# Patient Record
Sex: Male | Born: 1960 | Race: White | Hispanic: No | Marital: Married | Smoking: Never smoker
Health system: Southern US, Community
[De-identification: ages and names within clinical notes are randomized; demographics above are authoritative.]

## PROBLEM LIST (undated history)

## (undated) DIAGNOSIS — N200 Calculus of kidney: Secondary | ICD-10-CM

## (undated) HISTORY — PX: OTHER SURGICAL HISTORY: SHX169

## (undated) HISTORY — PX: APPENDECTOMY: SHX54

## (undated) HISTORY — DX: Calculus of kidney: N20.0

---

## 2004-05-14 ENCOUNTER — Ambulatory Visit: Payer: Self-pay | Admitting: Hematology & Oncology

## 2004-06-17 ENCOUNTER — Ambulatory Visit (HOSPITAL_COMMUNITY): Admission: RE | Admit: 2004-06-17 | Discharge: 2004-06-17 | Payer: Self-pay | Admitting: Hematology & Oncology

## 2004-07-02 ENCOUNTER — Ambulatory Visit (HOSPITAL_COMMUNITY): Admission: RE | Admit: 2004-07-02 | Discharge: 2004-07-02 | Payer: Self-pay | Admitting: Oncology

## 2005-08-12 ENCOUNTER — Emergency Department (HOSPITAL_COMMUNITY): Admission: EM | Admit: 2005-08-12 | Discharge: 2005-08-12 | Payer: Self-pay | Admitting: Emergency Medicine

## 2008-03-29 ENCOUNTER — Emergency Department (HOSPITAL_COMMUNITY): Admission: EM | Admit: 2008-03-29 | Discharge: 2008-03-29 | Payer: Self-pay | Admitting: Emergency Medicine

## 2008-04-09 ENCOUNTER — Ambulatory Visit (HOSPITAL_BASED_OUTPATIENT_CLINIC_OR_DEPARTMENT_OTHER): Admission: RE | Admit: 2008-04-09 | Discharge: 2008-04-09 | Payer: Self-pay | Admitting: Urology

## 2010-02-09 ENCOUNTER — Encounter: Payer: Self-pay | Admitting: Hematology & Oncology

## 2010-05-01 LAB — POCT I-STAT, CHEM 8
BUN: 34 mg/dL — ABNORMAL HIGH (ref 6–23)
Creatinine, Ser: 1.3 mg/dL (ref 0.4–1.5)
Glucose, Bld: 125 mg/dL — ABNORMAL HIGH (ref 70–99)
Glucose, Bld: 132 mg/dL — ABNORMAL HIGH (ref 70–99)
HCT: 46 % (ref 39.0–52.0)
HCT: 47 % (ref 39.0–52.0)
Hemoglobin: 16 g/dL (ref 13.0–17.0)
TCO2: 21 mmol/L (ref 0–100)

## 2010-05-01 LAB — CBC
HCT: 44.2 % (ref 39.0–52.0)
MCHC: 33.8 g/dL (ref 30.0–36.0)
MCV: 90.3 fL (ref 78.0–100.0)
RBC: 4.89 MIL/uL (ref 4.22–5.81)

## 2010-05-01 LAB — URINALYSIS, ROUTINE W REFLEX MICROSCOPIC
Bilirubin Urine: NEGATIVE
Specific Gravity, Urine: 1.024 (ref 1.005–1.030)
pH: 7 (ref 5.0–8.0)

## 2010-05-01 LAB — DIFFERENTIAL
Basophils Relative: 1 % (ref 0–1)
Eosinophils Relative: 3 % (ref 0–5)
Lymphocytes Relative: 21 % (ref 12–46)
Monocytes Absolute: 0.3 10*3/uL (ref 0.1–1.0)

## 2010-05-01 LAB — URINE MICROSCOPIC-ADD ON

## 2010-06-03 NOTE — Op Note (Signed)
NAME:  Duane Johnson, Duane Johnson                 ACCOUNT NO.:  [id number]   MEDICAL RECORD NO.:  [id number]          PATIENT TYPE:  AMB   LOCATION:  [facility]                         FACILITY:  [facility]   PHYSICIAN:  Sigmund I. Patsi Sears, M.D.DATE OF BIRTH:  1960/08/12   DATE OF PROCEDURE:  DATE OF DISCHARGE:                               OPERATIVE REPORT   PREOPERATIVE DIAGNOSIS:  History of 4.5 mm distal right ureteral  calculus.   POSTOPERATIVE DIAGNOSIS:  Passed right ureteral calculus.   OPERATIONS:  1. Cystourethroscopy.  2. Right retrograde pyelogram with interpretation.  3. Right ureteroscopy.   SURGEON:  S. Patsi Sears, M.D.   ANESTHESIA:  General LMA.   PREPARATION:  After appropriate preanesthesia, the patient was brought  to the operating placed on placed on the operating room table in the  dorsal supine position where general LMA anesthesia was induced.  He was  then replaced in the dorsal lithotomy position where the pubis was  prepped with Betadine solution and draped in the usual fashion.   REVIEW OF HISTORY:  This 50 year old male has history of a 4.5 mm right  ureteral stone, seen at [facility] on March 29, 2008,  with nausea and vomiting.  He has continued to have gross hematuria with  clot formation; strained his urine, but has not passed a stone.  On  Saturday evening, the patient passed more blood and clot, and looked  through the blood and clot, but did not see a stone.  He is now for  cystoscopy with retrograde pyelogram and ureteroscopy.   PROCEDURE:  Cystourethroscopy was accomplished, it shows a normal-  appearing urethra and normal bladder neck.  The bladder base was normal,  and there was no evidence of bladder, stone, tumor or diverticular  formation.  The right ureteral orifice was noted to be edematous, and  there was some blood at the right ureteral orifice.  A  KUB was  accomplished, but no stone was identified.  A right retrograde pyelogram  was performed and no stone was identified, although there was some  dilation of the lower ureter.  Ureteroscopy was therefore accomplished,  and I was unable to demonstrate the ureteral stone by ureteroscopy  either.  The entire urinary tract was evaluated up to the level of the  renal pelvis, and, again, I did not see a stone.  I could see evidence  in the distal ureter where the stone  may have been impacted.  The patient was given IV Toradol, awakened, and  taken to the recovery room in good condition.  The ureteroscope was  removed.  signed 04/12/08 @ 4pm      Sigmund I. Patsi Sears, M.D.  Electronically Signed     SIT/MEDQ  D:  04/09/2008  T:  04/09/2008  Job:  [id number]   cc:   [employer]

## 2011-12-11 ENCOUNTER — Encounter (HOSPITAL_COMMUNITY): Payer: Self-pay | Admitting: Emergency Medicine

## 2011-12-11 ENCOUNTER — Emergency Department (HOSPITAL_COMMUNITY): Payer: BC Managed Care – PPO

## 2011-12-11 ENCOUNTER — Emergency Department (HOSPITAL_COMMUNITY)
Admission: EM | Admit: 2011-12-11 | Discharge: 2011-12-11 | Disposition: A | Discharge status: Home / Self Care | Payer: BC Managed Care – PPO | Attending: Emergency Medicine | Admitting: Emergency Medicine

## 2011-12-11 DIAGNOSIS — Y9289 Other specified places as the place of occurrence of the external cause: Secondary | ICD-10-CM | POA: Insufficient documentation

## 2011-12-11 DIAGNOSIS — R040 Epistaxis: Secondary | ICD-10-CM | POA: Insufficient documentation

## 2011-12-11 DIAGNOSIS — R296 Repeated falls: Secondary | ICD-10-CM | POA: Insufficient documentation

## 2011-12-11 DIAGNOSIS — R220 Localized swelling, mass and lump, head: Secondary | ICD-10-CM | POA: Insufficient documentation

## 2011-12-11 DIAGNOSIS — Y9389 Activity, other specified: Secondary | ICD-10-CM | POA: Insufficient documentation

## 2011-12-11 DIAGNOSIS — S0120XA Unspecified open wound of nose, initial encounter: Secondary | ICD-10-CM | POA: Insufficient documentation

## 2011-12-11 DIAGNOSIS — R221 Localized swelling, mass and lump, neck: Secondary | ICD-10-CM | POA: Insufficient documentation

## 2011-12-11 DIAGNOSIS — S0121XA Laceration without foreign body of nose, initial encounter: Secondary | ICD-10-CM

## 2011-12-11 DIAGNOSIS — R51 Headache: Secondary | ICD-10-CM | POA: Insufficient documentation

## 2011-12-11 DIAGNOSIS — S0992XA Unspecified injury of nose, initial encounter: Secondary | ICD-10-CM

## 2011-12-11 DIAGNOSIS — S0993XA Unspecified injury of face, initial encounter: Secondary | ICD-10-CM | POA: Insufficient documentation

## 2011-12-11 NOTE — ED Provider Notes (Signed)
History     CSN: [id number]  Arrival date & time 12/11/11  1625   First MD Initiated Contact with Patient 12/11/11 1643      Chief Complaint  Patient presents with  . Facial Laceration    (Consider location/radiation/quality/duration/timing/severity/associated sxs/prior treatment) HPI Duane Johnson is a 51 y.o. male who presents with complaint of a nose injury. Pt states he was loading a truck and a ramp fell down onto his nose. States laceration and pain to the nose. No deformity. No LOC. States is having a headache. No confusion, memory problems, no dizziness, nausea, vomiting. Not on blood thinners. Tetanus up to date.   History reviewed. No pertinent past medical history.  History reviewed. No pertinent past surgical history.  History reviewed. No pertinent family history.  History  Substance Use Topics  . Smoking status: Never Smoker   . Smokeless tobacco: Not on file  . Alcohol Use: Yes      Review of Systems  Constitutional: Negative for fever and chills.  HENT: Positive for nosebleeds and facial swelling. Negative for neck pain.   Skin: Positive for wound.  Neurological: Positive for headaches. Negative for dizziness, weakness and numbness.    Allergies  Penicillins  Home Medications  No current outpatient prescriptions on file.  BP 115/76  Pulse 70  Temp 98.7 F (37.1 C) (Oral)  Resp 16  SpO2 99%  Physical Exam  Nursing note and vitals reviewed. Constitutional: He is oriented to person, place, and time. He appears well-developed and well-nourished. No distress.  HENT:  Head: Normocephalic.  Right Ear: External ear normal.  Left Ear: External ear normal.  Nose: Nose normal.  Mouth/Throat: Oropharynx is clear and moist.       TMs normal bilaterally, no hemotympanum. There is a V shaped, about 4cm laceration to the bridge of the nose, gaping, hemostatic. No deformity of the nose noted. No tenderness with palpation over the frontal bone, orbital  bones, or maxilla. No blood or septal hematoma intranasally.   Neck: Normal range of motion. Neck supple.  Cardiovascular: Normal rate, regular rhythm and normal heart sounds.   Pulmonary/Chest: Effort normal and breath sounds normal. No respiratory distress. He has no wheezes. He has no rales.  Neurological: He is alert and oriented to person, place, and time. No cranial nerve deficit. Coordination normal.  Skin: Skin is warm and dry.  Psychiatric: He has a normal mood and affect.    ED Course  Procedures (including critical care time)  Pt with laceration to the bridge of the nose. Will get X-rays.   Dg Nasal Bones  12/11/2011  *RADIOLOGY REPORT*  Clinical Data: Nasal laceration.  NASAL BONES - 3+ VIEW  Comparison: None.  Findings: No evidence of acute nasal bone fracture.  The nasal septum appears midline.  Other visualized facial bones and paranasal sinuses are unremarkable.  IMPRESSION: No evidence of nasal bone fracture.   Original Report Authenticated By: Irish Lack, M.D.    LACERATION REPAIR Performed by: Lottie Mussel Authorized by: Jaynie Crumble A Consent: Verbal consent obtained. Risks and benefits: risks, benefits and alternatives were discussed Consent given by: patient Patient identity confirmed: provided demographic data Prepped and Draped in normal sterile fashion Wound explored  Laceration Location: bridge of the nose  Laceration Length: 4cm  No Foreign Bodies seen or palpated  Anesthesia: local infiltration  Local anesthetic: lidocaine 1% wo epinephrine  Anesthetic total: 3 ml  Irrigation method: syringe Amount of cleaning: standard  Skin closure: prolene  6.0  Number of sutures: 9  Technique: simple interrupted  Patient tolerance: Patient tolerated the procedure well with no immediate complications.   1. Nasal injury   2. Laceration of nose       MDM  Pt with acute nasal laceration/contusion. Laceration repaired with  stitches. X-ray negative for fracture. No signs of major head trauma. Pt non toxic. D/c home with topical antibiotics, ice, NSAIDs.         Lottie Mussel, PA 12/11/11 1803

## 2011-12-11 NOTE — ED Notes (Signed)
Pt states he was loading a truck when the gate fell down and grazed his nose causing a 1" V shaped laceration.  Pt states he is still able to breathe through nose.  Pt states last tetanus shot 2-3 years ago.  No deformities on nose besides laceration.  Pt denies hitting head.

## 2011-12-12 NOTE — ED Provider Notes (Signed)
Medical screening examination/treatment/procedure(s) were performed by non-physician practitioner and as supervising physician I was immediately available for consultation/collaboration. Devoria Albe, MD, Armando Gang   Ward Givens, MD 12/12/11 [phone]

## 2013-03-29 IMAGING — CR DG NASAL BONES 3+V
3 series · 3 of 3 positions shown · non-contrast
Comparison: None.

CLINICAL DATA: Nasal laceration.

NASAL BONES - 3+ VIEW

[w nasal bone lat (1 of 2)]
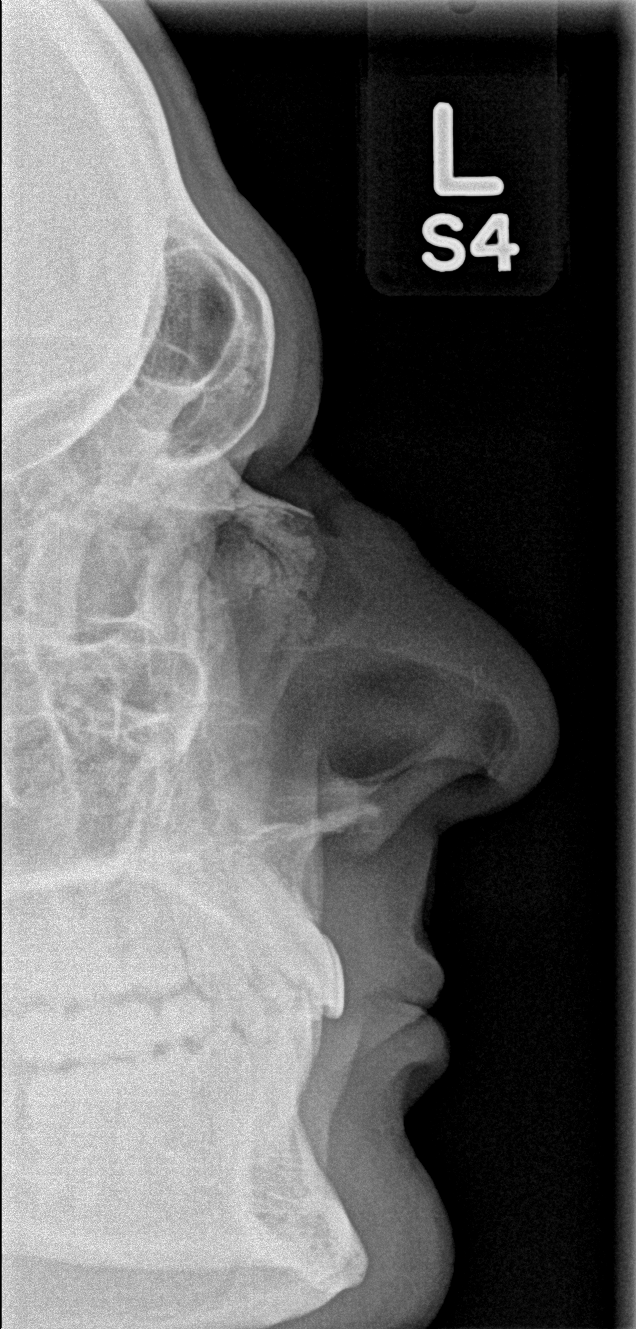

[w nasal bone lat (2 of 2)]
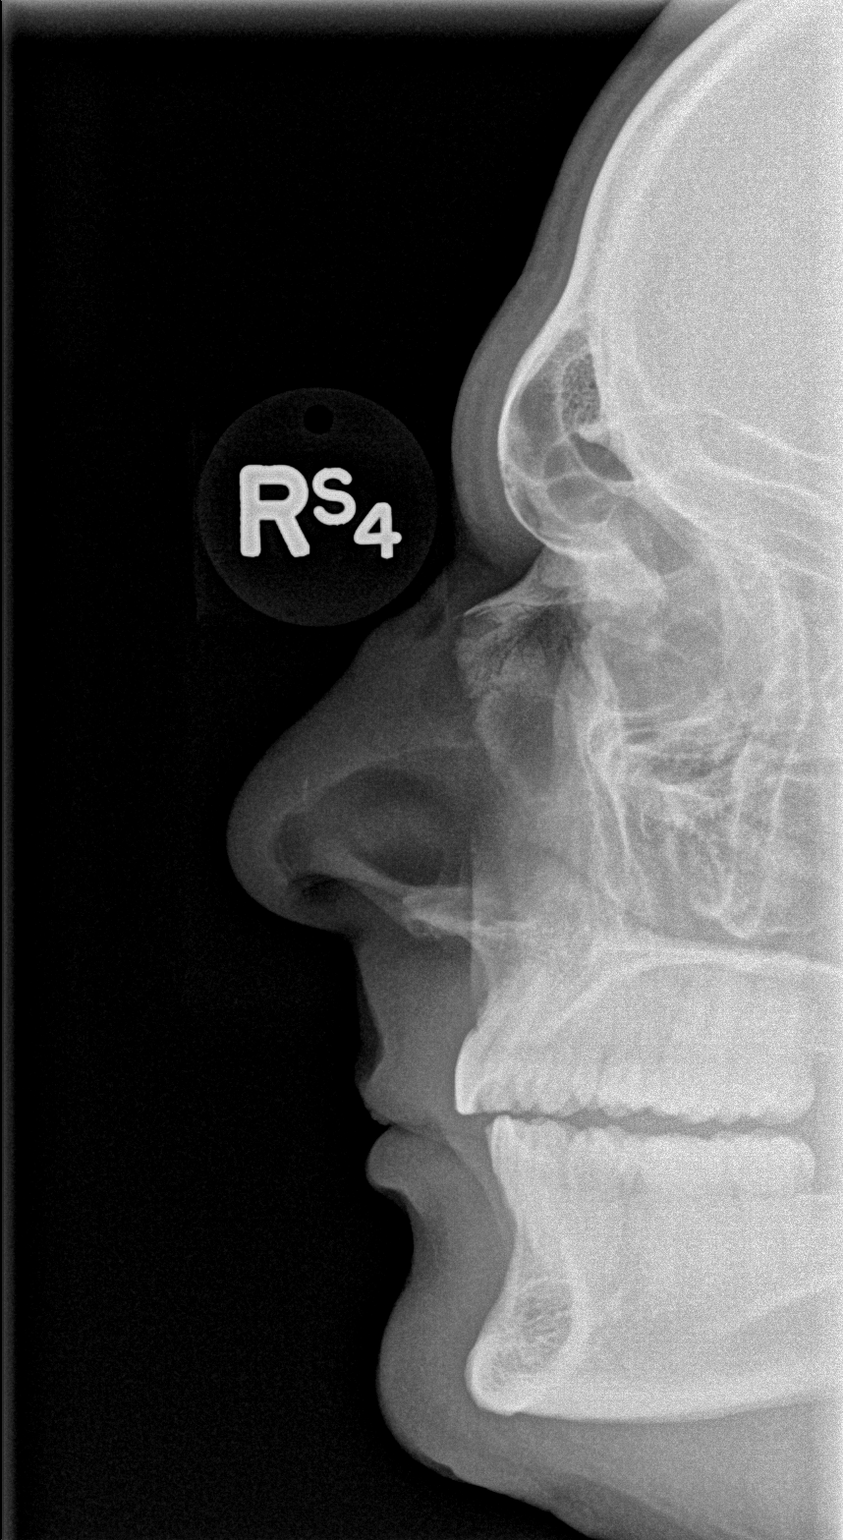

[w waters pa]
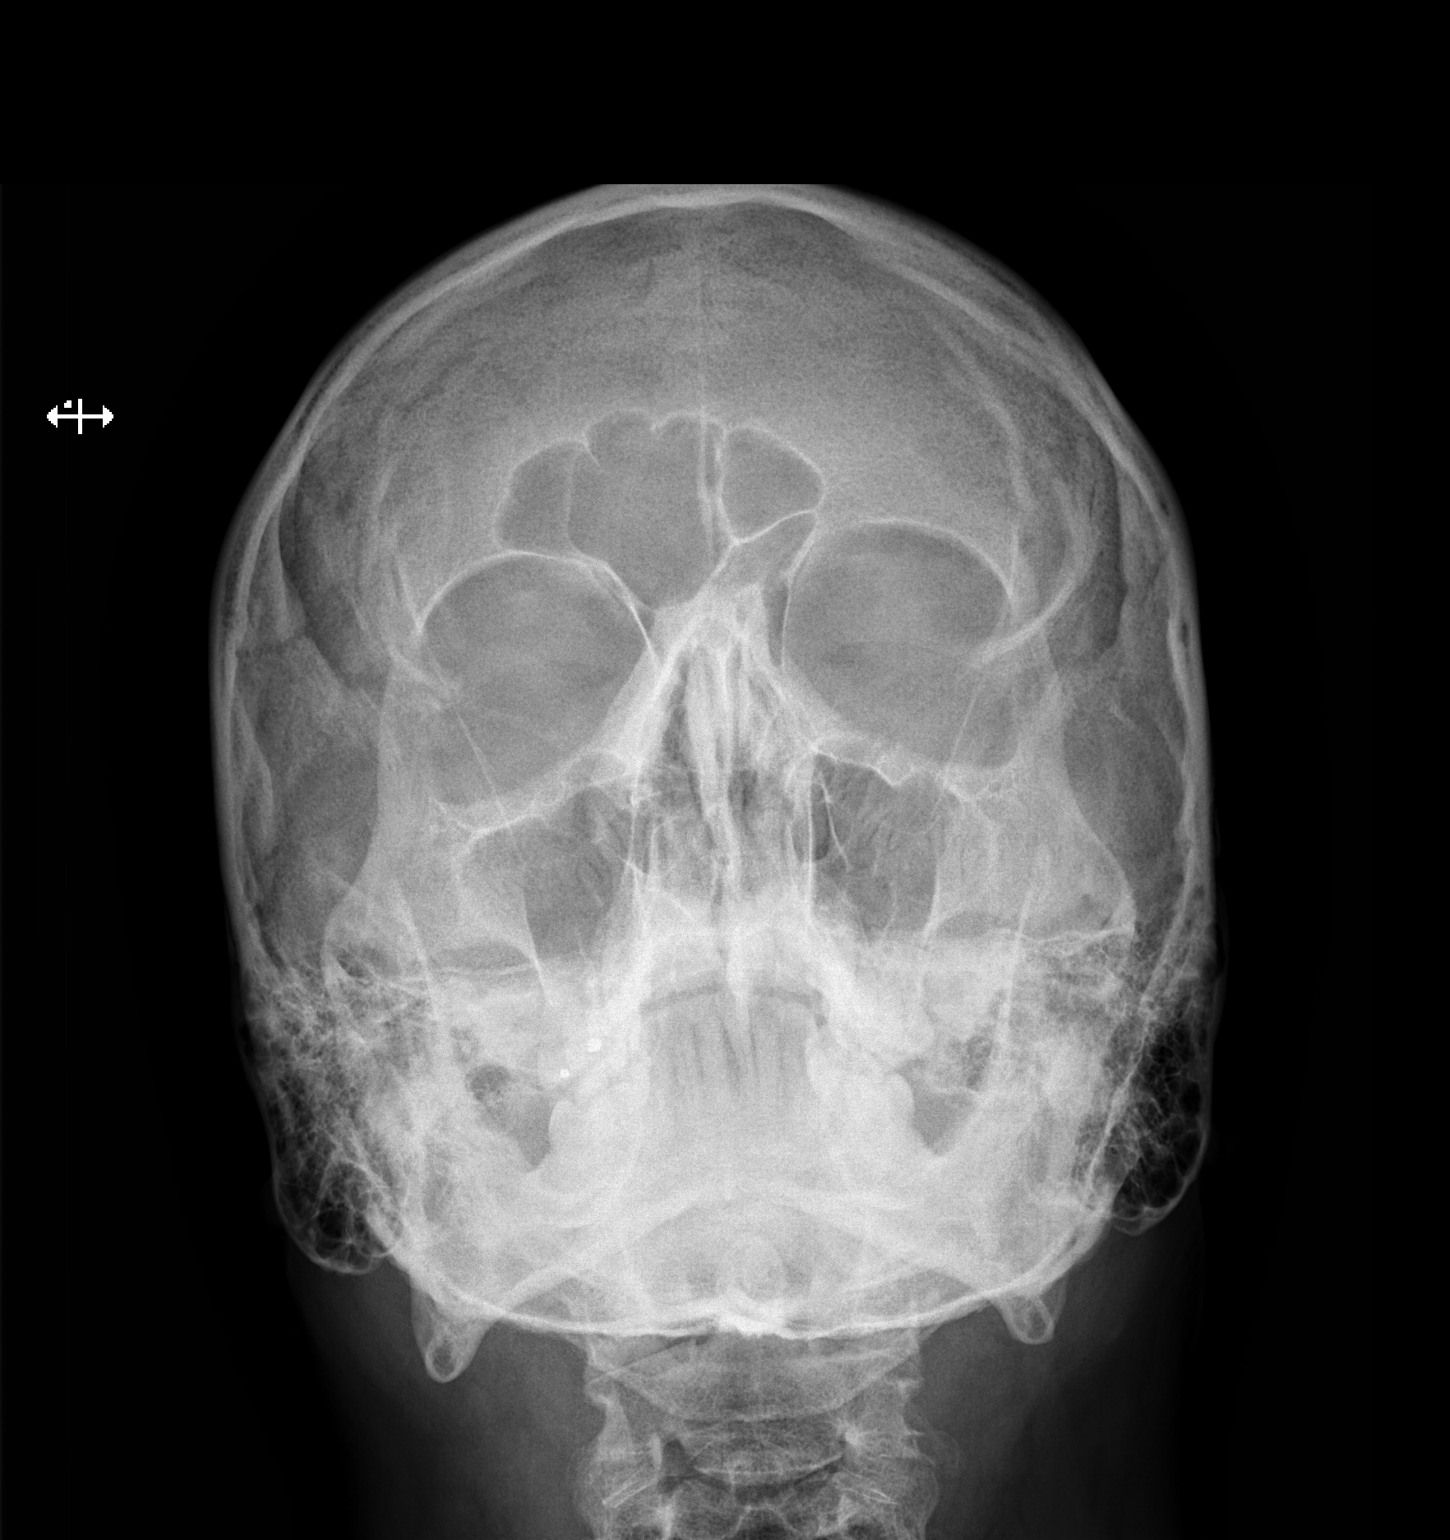

[3 of 3 positions shown; findings below may reference images not displayed]

FINDINGS: No evidence of acute nasal bone fracture.  The nasal
septum appears midline.  Other visualized facial bones and
paranasal sinuses are unremarkable.
IMPRESSION: No evidence of nasal bone fracture.

## 2014-07-29 ENCOUNTER — Emergency Department (HOSPITAL_COMMUNITY)
Admission: EM | Admit: 2014-07-29 | Discharge: 2014-07-29 | Disposition: A | Discharge status: Home / Self Care | Payer: Self-pay | Attending: Emergency Medicine | Admitting: Emergency Medicine

## 2014-07-29 ENCOUNTER — Encounter (HOSPITAL_COMMUNITY): Payer: Self-pay

## 2014-07-29 DIAGNOSIS — Z9089 Acquired absence of other organs: Secondary | ICD-10-CM | POA: Insufficient documentation

## 2014-07-29 DIAGNOSIS — R61 Generalized hyperhidrosis: Secondary | ICD-10-CM | POA: Insufficient documentation

## 2014-07-29 DIAGNOSIS — Z87442 Personal history of urinary calculi: Secondary | ICD-10-CM | POA: Insufficient documentation

## 2014-07-29 DIAGNOSIS — Z880 Allergy status to penicillin: Secondary | ICD-10-CM | POA: Insufficient documentation

## 2014-07-29 DIAGNOSIS — R319 Hematuria, unspecified: Secondary | ICD-10-CM | POA: Insufficient documentation

## 2014-07-29 DIAGNOSIS — R1030 Lower abdominal pain, unspecified: Secondary | ICD-10-CM | POA: Insufficient documentation

## 2014-07-29 DIAGNOSIS — F1099 Alcohol use, unspecified with unspecified alcohol-induced disorder: Secondary | ICD-10-CM | POA: Insufficient documentation

## 2014-07-29 DIAGNOSIS — R112 Nausea with vomiting, unspecified: Secondary | ICD-10-CM | POA: Insufficient documentation

## 2014-07-29 LAB — URINALYSIS, ROUTINE W REFLEX MICROSCOPIC
BILIRUBIN URINE: NEGATIVE
Glucose, UA: NEGATIVE mg/dL
Ketones, ur: NEGATIVE mg/dL
Leukocytes, UA: NEGATIVE
NITRITE: NEGATIVE
PH: 5.5 (ref 5.0–8.0)
Protein, ur: NEGATIVE mg/dL
SPECIFIC GRAVITY, URINE: 1.018 (ref 1.005–1.030)
Urobilinogen, UA: 0.2 mg/dL (ref 0.0–1.0)

## 2014-07-29 LAB — URINE MICROSCOPIC-ADD ON

## 2014-07-29 MED ORDER — OXYCODONE-ACETAMINOPHEN 5-325 MG PO TABS: 1.0000 | ORAL_TABLET | Freq: Four times a day (QID) | ORAL | Status: AC | PRN

## 2014-07-29 MED ORDER — ONDANSETRON HCL 4 MG PO TABS: 4.0000 mg | ORAL_TABLET | Freq: Four times a day (QID) | ORAL | Status: DC

## 2014-07-29 MED ORDER — TAMSULOSIN HCL 0.4 MG PO CAPS: 0.4000 mg | ORAL_CAPSULE | Freq: Every day | ORAL | Status: DC

## 2014-07-29 NOTE — ED Notes (Signed)
Pt was also given 30 Toradol by EMS

## 2014-07-29 NOTE — ED Notes (Signed)
Per EMS - pt hx of kidney stones. C/o suprapubic pain - 10/10. Pt was nauseated, vomiting, and diaphoretic upon EMS arrival. Given 250 fentanyl and 4 zofran with relief. BP 115/68, hr 50bpm.

## 2014-07-29 NOTE — ED Provider Notes (Signed)
CSN: [id number]     Arrival date & time 07/29/14  0753 History   First MD Initiated Contact with Patient 07/29/14 0754     Chief Complaint  Patient presents with  . Abdominal Pain     (Consider location/radiation/quality/duration/timing/severity/associated sxs/prior Treatment) HPI Comments: 54 year old male presenting with sudden onset, 10/10 epigastric abdominal pain that migrated down to his suprapubic area beginning at 4 AM today when he woke up to urinate.  Pain was constant until EMS arrival, when he was given 250 mcg fentanyl, 30 mg Toradol and 4 mg zofran with complete relief of his pain.  Admits to associated nausea, diaphoresis and an episode of watery emesis.  History of a kidney stone in the past, states this pain is more intense in the pain with his kidney stone again in his back.  Denies back pain, CP, SOB.  Denies increased urinary frequency, urgency, dysuria or hematuria.  Denies fevers or diarrhea.  Social alcohol use only, has not had any alcohol in about a week.  No dietary changes.  Patient is a 54 y.o. male presenting with abdominal pain. The history is provided by the patient and the EMS personnel.  Abdominal Pain Associated symptoms: nausea and vomiting     History reviewed. No pertinent past medical history. Past Surgical History  Procedure Laterality Date  . Appendectomy     History reviewed. No pertinent family history. History  Substance Use Topics  . Smoking status: Never Smoker   . Smokeless tobacco: Not on file  . Alcohol Use: Yes     Comment: occasional    Review of Systems  Constitutional: Positive for diaphoresis.  Gastrointestinal: Positive for nausea, vomiting and abdominal pain.  All other systems reviewed and are negative.     Allergies  Penicillins  Home Medications   Prior to Admission medications   Medication Sig Start Date End Date Taking? Authorizing Provider  ondansetron (ZOFRAN) 4 MG tablet Take 1 tablet (4 mg total) by mouth  every 6 (six) hours. 07/29/14   Kathrynn Speed, PA-C  oxyCODONE-acetaminophen (PERCOCET) 5-325 MG per tablet Take 1-2 tablets by mouth every 6 (six) hours as needed for severe pain. 07/29/14   Kathrynn Speed, PA-C  tamsulosin (FLOMAX) 0.4 MG CAPS capsule Take 1 capsule (0.4 mg total) by mouth daily. 07/29/14   Noriko Macari M Patriciaann Rabanal, PA-C   BP 115/72 mmHg  Pulse 66  Temp(Src) 98.4 F (36.9 C) (Oral)  Resp 16  Ht  (1.727 m)  Wt 180 lb (81.647 kg)  BMI 27.38 kg/m2  SpO2 95% Physical Exam  Constitutional: He is oriented to person, place, and time. He appears well-developed and well-nourished. No distress.  HENT:  Head: Normocephalic and atraumatic.  Eyes: Conjunctivae and EOM are normal.  Neck: Normal range of motion. Neck supple.  Cardiovascular: Normal rate, regular rhythm and normal heart sounds.   Pulmonary/Chest: Effort normal and breath sounds normal.  Abdominal: Soft. Normal appearance and bowel sounds are normal. He exhibits no distension. There is no tenderness. There is no rigidity, no rebound, no guarding and no CVA tenderness.  Musculoskeletal: Normal range of motion. He exhibits no edema.  Neurological: He is alert and oriented to person, place, and time.  Skin: Skin is warm and dry.  Psychiatric: He has a normal mood and affect. His behavior is normal.  Nursing note and vitals reviewed.   ED Course  Procedures (including critical care time) Labs Review Labs Reviewed  URINALYSIS, ROUTINE W REFLEX MICROSCOPIC (NOT AT [facility]) -  Abnormal; Notable for the following:    Hgb urine dipstick LARGE (*)    All other components within normal limits  URINE MICROSCOPIC-ADD ON    Imaging Review No results found.   EKG Interpretation None      MDM   Final diagnoses:  Suprapubic abdominal pain, unspecified laterality  Hematuria   Nontoxic appearing, NAD.  AF VSS.  Asymptomatic in the ED.  Abdomen is soft, nontender.  No CVA tenderness.  UA significant for hematuria.  Culture pending.   I feel the patient's pain most likely came from a kidney or ureteral stone.  I do not feel imaging is necessary at this time as patient is having no difficulty urinating and will not change course of care.  Hematuria, most likely from the kidney stone being present or past.  Will discharge patient home with Flomax, urine strainer, Zofran, and Percocet.  Follow-up with urology.  Stable for discharge. Return precautions given. Patient states understanding of treatment care plan and is agreeable.  Kathrynn SpeedRobyn M Torre Pikus, PA-C 07/29/14 [id number]  Mirian MoMatthew Gentry, MD 07/30/14 [phone]

## 2014-07-29 NOTE — Discharge Instructions (Signed)
Take Flomax as prescribed. Take Zofran as directed as needed for nausea. Take percocet for severe pain only. No driving or operating heavy machinery while taking percocet. This medication may cause drowsiness. Kidney Stones Kidney stones (urolithiasis) are deposits that form inside your kidneys. The intense pain is caused by the stone moving through the urinary tract. When the stone moves, the ureter goes into spasm around the stone. The stone is usually passed in the urine.  CAUSES   A disorder that makes certain neck glands produce too much parathyroid hormone (primary hyperparathyroidism).  A buildup of uric acid crystals, similar to gout in your joints.  Narrowing (stricture) of the ureter.  A kidney obstruction present at birth (congenital obstruction).  Previous surgery on the kidney or ureters.  Numerous kidney infections. SYMPTOMS   Feeling sick to your stomach (nauseous).  Throwing up (vomiting).  Blood in the urine (hematuria).  Pain that usually spreads (radiates) to the groin.  Frequency or urgency of urination. DIAGNOSIS   Taking a history and physical exam.  Blood or urine tests.  CT scan.  Occasionally, an examination of the inside of the urinary bladder (cystoscopy) is performed. TREATMENT   Observation.  Increasing your fluid intake.  Extracorporeal shock wave lithotripsy--This is a noninvasive procedure that uses shock waves to break up kidney stones.  Surgery may be needed if you have severe pain or persistent obstruction. There are various surgical procedures. Most of the procedures are performed with the use of small instruments. Only small incisions are needed to accommodate these instruments, so recovery time is minimized. The size, location, and chemical composition are all important variables that will determine the proper choice of action for you. Talk to your health care provider to better understand your situation so that you will minimize the  risk of injury to yourself and your kidney.  HOME CARE INSTRUCTIONS   Drink enough water and fluids to keep your urine clear or pale yellow. This will help you to pass the stone or stone fragments.  Strain all urine through the provided strainer. Keep all particulate matter and stones for your health care provider to see. The stone causing the pain may be as small as a grain of salt. It is very important to use the strainer each and every time you pass your urine. The collection of your stone will allow your health care provider to analyze it and verify that a stone has actually passed. The stone analysis will often identify what you can do to reduce the incidence of recurrences.  Only take over-the-counter or prescription medicines for pain, discomfort, or fever as directed by your health care provider.  Make a follow-up appointment with your health care provider as directed.  Get follow-up X-rays if required. The absence of pain does not always mean that the stone has passed. It may have only stopped moving. If the urine remains completely obstructed, it can cause loss of kidney function or even complete destruction of the kidney. It is your responsibility to make sure X-rays and follow-ups are completed. Ultrasounds of the kidney can show blockages and the status of the kidney. Ultrasounds are not associated with any radiation and can be performed easily in a matter of minutes. SEEK MEDICAL CARE IF:  You experience pain that is progressive and unresponsive to any pain medicine you have been prescribed. SEEK IMMEDIATE MEDICAL CARE IF:   Pain cannot be controlled with the prescribed medicine.  You have a fever or shaking chills.  The  severity or intensity of pain increases over 18 hours and is not relieved by pain medicine.  You develop a new onset of abdominal pain.  You feel faint or pass out.  You are unable to urinate. MAKE SURE YOU:   Understand these instructions.  Will watch  your condition.  Will get help right away if you are not doing well or get worse. Document Released: 01/05/2005 Document Revised: 09/07/2012 Document Reviewed: 06/08/2012 [facility] Patient Information 2015 [place], [place]. This information is not intended to replace advice given to you by your health care provider. Make sure you discuss any questions you have with your health care provider.  Hematuria Hematuria is blood in your urine. It can be caused by a bladder infection, kidney infection, prostate infection, kidney stone, or cancer of your urinary tract. Infections can usually be treated with medicine, and a kidney stone usually will pass through your urine. If neither of these is the cause of your hematuria, further workup to find out the reason may be needed. It is very important that you tell your health care provider about any blood you see in your urine, even if the blood stops without treatment or happens without causing pain. Blood in your urine that happens and then stops and then happens again can be a symptom of a very serious condition. Also, pain is not a symptom in the initial stages of many urinary cancers. HOME CARE INSTRUCTIONS   Drink lots of fluid, 3-4 quarts a day. If you have been diagnosed with an infection, cranberry juice is especially recommended, in addition to large amounts of water.  Avoid caffeine, tea, and carbonated beverages because they tend to irritate the bladder.  Avoid alcohol because it may irritate the prostate.  Take all medicines as directed by your health care provider.  If you were prescribed an antibiotic medicine, finish it all even if you start to feel better.  If you have been diagnosed with a kidney stone, follow your health care provider's instructions regarding straining your urine to catch the stone.  Empty your bladder often. Avoid holding urine for long periods of time.  After a bowel movement, women should cleanse front to back. Use  each tissue only once.  Empty your bladder before and after sexual intercourse if you are a male. SEEK MEDICAL CARE IF:  You develop back pain.  You have a fever.  You have a feeling of sickness in your stomach (nausea) or vomiting.  Your symptoms are not better in 3 days. Return sooner if you are getting worse. SEEK IMMEDIATE MEDICAL CARE IF:   You develop severe vomiting and are unable to keep the medicine down.  You develop severe back or abdominal pain despite taking your medicines.  You begin passing a large amount of blood or clots in your urine.  You feel extremely weak or faint, or you pass out. MAKE SURE YOU:   Understand these instructions.  Will watch your condition.  Will get help right away if you are not doing well or get worse. Document Released: 01/05/2005 Document Revised: 05/22/2013 Document Reviewed: 09/05/2012 [facility] Patient Information 2015 [place], [place]. This information is not intended to replace advice given to you by your health care provider. Make sure you discuss any questions you have with your health care provider.

## 2014-07-30 LAB — URINE CULTURE

## 2022-02-11 ENCOUNTER — Other Ambulatory Visit: Payer: Self-pay

## 2022-02-11 ENCOUNTER — Emergency Department (HOSPITAL_COMMUNITY)
Admission: EM | Admit: 2022-02-11 | Discharge: 2022-02-11 | Disposition: A | Discharge status: Home / Self Care | Payer: BC Managed Care – PPO | Attending: Emergency Medicine | Admitting: Emergency Medicine

## 2022-02-11 ENCOUNTER — Emergency Department (HOSPITAL_COMMUNITY): Payer: BC Managed Care – PPO

## 2022-02-11 ENCOUNTER — Encounter (HOSPITAL_COMMUNITY): Payer: Self-pay

## 2022-02-11 DIAGNOSIS — K769 Liver disease, unspecified: Secondary | ICD-10-CM | POA: Diagnosis not present

## 2022-02-11 DIAGNOSIS — N134 Hydroureter: Secondary | ICD-10-CM | POA: Diagnosis not present

## 2022-02-11 DIAGNOSIS — D1800 Hemangioma unspecified site: Secondary | ICD-10-CM | POA: Diagnosis not present

## 2022-02-11 DIAGNOSIS — R112 Nausea with vomiting, unspecified: Secondary | ICD-10-CM | POA: Diagnosis not present

## 2022-02-11 DIAGNOSIS — N1330 Unspecified hydronephrosis: Secondary | ICD-10-CM | POA: Diagnosis not present

## 2022-02-11 DIAGNOSIS — N200 Calculus of kidney: Secondary | ICD-10-CM

## 2022-02-11 DIAGNOSIS — R1032 Left lower quadrant pain: Secondary | ICD-10-CM | POA: Diagnosis not present

## 2022-02-11 DIAGNOSIS — R109 Unspecified abdominal pain: Secondary | ICD-10-CM | POA: Diagnosis not present

## 2022-02-11 LAB — URINALYSIS, ROUTINE W REFLEX MICROSCOPIC
Bilirubin Urine: NEGATIVE
Glucose, UA: NEGATIVE mg/dL
Ketones, ur: 20 mg/dL — AB
Leukocytes,Ua: NEGATIVE
Nitrite: NEGATIVE
Protein, ur: NEGATIVE mg/dL
Specific Gravity, Urine: >1.046 — ABNORMAL HIGH (ref 1.005–1.030)
pH: 5 (ref 5.0–8.0)

## 2022-02-11 LAB — COMPREHENSIVE METABOLIC PANEL
ALT: 28 U/L (ref 0–44)
AST: 20 U/L (ref 15–41)
Albumin: 4.1 g/dL (ref 3.5–5.0)
Alkaline Phosphatase: 61 U/L (ref 38–126)
Anion gap: 8 (ref 5–15)
BUN: 15 mg/dL (ref 8–23)
CO2: 24 mmol/L (ref 22–32)
Calcium: 9 mg/dL (ref 8.9–10.3)
Chloride: 107 mmol/L (ref 98–111)
Creatinine, Ser: 1.04 mg/dL (ref 0.61–1.24)
GFR, Estimated: >60 mL/min (ref >60)
Glucose, Bld: 138 mg/dL — ABNORMAL HIGH (ref 70–99)
Potassium: 4.1 mmol/L (ref 3.5–5.1)
Sodium: 139 mmol/L (ref 135–145)
Total Bilirubin: 0.8 mg/dL (ref 0.3–1.2)
Total Protein: 6.8 g/dL (ref 6.5–8.1)

## 2022-02-11 LAB — CBC WITH DIFFERENTIAL/PLATELET
Abs Immature Granulocytes: 0.05 10*3/uL (ref 0.00–0.07)
Basophils Absolute: 0.1 10*3/uL (ref 0.0–0.1)
Basophils Relative: 1 %
Eosinophils Absolute: 0.1 10*3/uL (ref 0.0–0.5)
Eosinophils Relative: 1 %
HCT: 42.8 % (ref 39.0–52.0)
Hemoglobin: 14.7 g/dL (ref 13.0–17.0)
Immature Granulocytes: 1 %
Lymphocytes Relative: 11 %
Lymphs Abs: 1.1 10*3/uL (ref 0.7–4.0)
MCH: 30.6 pg (ref 26.0–34.0)
MCHC: 34.3 g/dL (ref 30.0–36.0)
MCV: 89.2 fL (ref 80.0–100.0)
Monocytes Absolute: 0.5 10*3/uL (ref 0.1–1.0)
Monocytes Relative: 5 %
Neutro Abs: 8.3 10*3/uL — ABNORMAL HIGH (ref 1.7–7.7)
Neutrophils Relative %: 81 %
Platelets: 237 10*3/uL (ref 150–400)
RBC: 4.8 MIL/uL (ref 4.22–5.81)
RDW: 12.6 % (ref 11.5–15.5)
WBC: 10.1 10*3/uL (ref 4.0–10.5)
nRBC: 0 % (ref 0.0–0.2)

## 2022-02-11 LAB — LIPASE, BLOOD: Lipase: 33 U/L (ref 11–51)

## 2022-02-11 MED ORDER — ONDANSETRON HCL 4 MG PO TABS: 4.0000 mg | ORAL_TABLET | Freq: Four times a day (QID) | ORAL | 0 refills | Status: AC | PRN

## 2022-02-11 MED ORDER — IOHEXOL 350 MG/ML SOLN
75.0000 mL | Freq: Once | INTRAVENOUS | Status: AC | PRN
  Administered 2022-02-11: 75 mL via INTRAVENOUS

## 2022-02-11 MED ORDER — SODIUM CHLORIDE 0.9 % IV BOLUS
1000.0000 mL | Freq: Once | INTRAVENOUS | Status: AC
  Administered 2022-02-11: 1000 mL via INTRAVENOUS

## 2022-02-11 MED ORDER — OXYCODONE-ACETAMINOPHEN 5-325 MG PO TABS
1.0000 | ORAL_TABLET | Freq: Once | ORAL | Status: AC
  Administered 2022-02-11: 1 via ORAL
  Filled 2022-02-11: qty 1

## 2022-02-11 MED ORDER — GADOBUTROL 1 MMOL/ML IV SOLN
8.0000 mL | Freq: Once | INTRAVENOUS | Status: AC | PRN
  Administered 2022-02-11: 8 mL via INTRAVENOUS

## 2022-02-11 MED ORDER — KETOROLAC TROMETHAMINE 15 MG/ML IJ SOLN
15.0000 mg | Freq: Once | INTRAMUSCULAR | Status: AC
  Administered 2022-02-11: 15 mg via INTRAVENOUS
  Filled 2022-02-11: qty 1

## 2022-02-11 MED ORDER — ONDANSETRON 4 MG PO TBDP
4.0000 mg | ORAL_TABLET | Freq: Once | ORAL | Status: AC
  Administered 2022-02-11: 4 mg via ORAL
  Filled 2022-02-11: qty 1

## 2022-02-11 MED ORDER — TAMSULOSIN HCL 0.4 MG PO CAPS
0.4000 mg | ORAL_CAPSULE | Freq: Once | ORAL | Status: AC
  Administered 2022-02-11: 0.4 mg via ORAL
  Filled 2022-02-11: qty 1

## 2022-02-11 MED ORDER — HYDROCODONE-ACETAMINOPHEN 5-325 MG PO TABS: 2.0000 | ORAL_TABLET | ORAL | 0 refills | Status: AC | PRN

## 2022-02-11 MED ORDER — TAMSULOSIN HCL 0.4 MG PO CAPS: 0.4000 mg | ORAL_CAPSULE | Freq: Every day | ORAL | 0 refills | Status: AC

## 2022-02-11 MED ORDER — KETOROLAC TROMETHAMINE 10 MG PO TABS: 10.0000 mg | ORAL_TABLET | Freq: Four times a day (QID) | ORAL | 0 refills | Status: AC | PRN

## 2022-02-11 NOTE — Discharge Instructions (Signed)
Please return to the ED with any new symptoms such as fevers, continued nausea or vomiting not responsive to Zofran, inability to urinate Please follow back up with urology as we discussed.  I have referred you to a group here in [place].  If you elect to see the urologist you have seen in the past that is also fine I have sent in medications to the pharmacy that we discussed.  Please take tamsulosin once a day.  Please take antinausea medication Zofran every 6 hours as needed.  Please take Toradol and hydrocodone as needed.  Please do not drive or operate heavy machinery under the influence of hydrocodone. Please continue pushing fluids to include water. Please read attached guide concerning kidney stones

## 2022-02-11 NOTE — ED Provider Triage Note (Signed)
Emergency Medicine Provider Triage Evaluation Note  Duane Johnson , a 62 y.o. male  was evaluated in triage.  Pt complains of abd pain. Acute onset of suprapubic pain this AM, associated nausea and vomiting.  Unable to get comfortable.  No hx of AAA.  Hx of kidney stone but denies flank pain.  No fever/chills  Review of Systems  Positive: As above Negative: As above  Physical Exam  BP 119/69   Pulse (!) 59   Temp 98.6 F (37 C) (Oral)   Resp (!) 22   Ht 5\' 8"  (1.727 m)   Wt 79.4 kg   SpO2 100%   BMI 26.61 kg/m  Gen:   Awake, uncomfortable Resp:  Normal effort  MSK:   Moves extremities without difficulty  Other:  No cva tenderness.  Ttp suprapubic and LLQ  Medical Decision Making  Medically screening exam initiated at 11:11 AM.  Appropriate orders placed.  Leata Mouse was informed that the remainder of the evaluation will be completed by another provider, this initial triage assessment does not replace that evaluation, and the importance of remaining in the ED until their evaluation is complete.     Domenic Moras, PA-C 02/11/22 1112

## 2022-02-11 NOTE — ED Triage Notes (Signed)
Pt came in via POV from home d/t abd pain plus n/v that started this morning & come in waves. Pt state that this feels somewhat like kidney stones. While in the lobby pt states that he felt faint & started laying on the floor so he could be flat where he states it feels better that way. Does have Hx of kidney stones, rates abd pain 10/10, A/Ox4.

## 2022-02-11 NOTE — ED Provider Notes (Signed)
[facility] Provider Note   CSN: [id number] Arrival date & time: 02/11/22  1022     History  Chief Complaint  Patient presents with   Abdominal Pain   Emesis   Nausea   Feeling Faint    Duane Johnson is a 62 y.o. male with medical history of appendectomy, kidney stones.  Patient presents to the ED for evaluation of left-sided flank pain, nausea and vomiting, dysuria.  Patient reports that this morning around 830 he developed sudden onset left-sided suprapubic abdominal pain.  The patient reports over the course the day that his suprapubic abdominal pain slowly migrated into his left flank.  Patient states he had nausea and vomiting at home along with feelings of being unable to completely empty bladder.  Patient denies any fevers, penile discharge, hematuria, chest pain, shortness of breath.  Patient does endorse history of kidney stones.  Patient reports last kidney stone was more than 2 years ago.  Patient denies currently being seen by urology.   Abdominal Pain Associated symptoms: dysuria, nausea and vomiting   Associated symptoms: no chest pain, no fever, no hematuria and no shortness of breath   Emesis Associated symptoms: abdominal pain   Associated symptoms: no fever        Home Medications Prior to Admission medications   Medication Sig Start Date End Date Taking? Authorizing Provider  HYDROcodone-acetaminophen (NORCO/VICODIN) 5-325 MG tablet Take 2 tablets by mouth every 4 (four) hours as needed. 02/11/22  Yes Azucena Cecil, PA-C  ketorolac (TORADOL) 10 MG tablet Take 1 tablet (10 mg total) by mouth every 6 (six) hours as needed. 02/11/22  Yes Azucena Cecil, PA-C  ondansetron (ZOFRAN) 4 MG tablet Take 1 tablet (4 mg total) by mouth every 6 (six) hours as needed for nausea or vomiting. 02/11/22  Yes Azucena Cecil, PA-C  tamsulosin (FLOMAX) 0.4 MG CAPS capsule Take 1 capsule (0.4 mg total) by mouth daily.  02/11/22  Yes Azucena Cecil, PA-C  oxyCODONE-acetaminophen (PERCOCET) 5-325 MG per tablet Take 1-2 tablets by mouth every 6 (six) hours as needed for severe pain. 07/29/14   Hess, Hessie Diener, PA-C      Allergies    Penicillins    Review of Systems   Review of Systems  Constitutional:  Negative for fever.  Respiratory:  Negative for shortness of breath.   Cardiovascular:  Negative for chest pain.  Gastrointestinal:  Positive for abdominal pain, nausea and vomiting.  Genitourinary:  Positive for difficulty urinating and dysuria. Negative for hematuria and penile discharge.  All other systems reviewed and are negative.   Physical Exam Updated Vital Signs BP 117/76   Pulse 85   Temp 97.6 F (36.4 C) (Oral)   Resp 16   Ht 5\' 8"  (1.727 m)   Wt 79.4 kg   SpO2 93%   BMI 26.61 kg/m  Physical Exam Constitutional:      General: He is not in acute distress.    Appearance: He is not ill-appearing, toxic-appearing or diaphoretic.  HENT:     Head: Normocephalic and atraumatic.     Mouth/Throat:     Mouth: Mucous membranes are moist.     Pharynx: No oropharyngeal exudate or posterior oropharyngeal erythema.  Eyes:     Conjunctiva/sclera: Conjunctivae normal.     Pupils: Pupils are equal, round, and reactive to light.  Cardiovascular:     Rate and Rhythm: Normal rate and regular rhythm.  Pulmonary:  Effort: Pulmonary effort is normal.     Breath sounds: Normal breath sounds. No wheezing.  Abdominal:     General: Abdomen is flat.     Palpations: Abdomen is soft.     Tenderness: There is left CVA tenderness.  Musculoskeletal:     Cervical back: Normal range of motion and neck supple. No tenderness.  Skin:    General: Skin is warm and dry.     Capillary Refill: Capillary refill takes less than 2 seconds.  Neurological:     Mental Status: He is alert and oriented to person, place, and time.     ED Results / Procedures / Treatments   Labs (all labs ordered are listed, but  only abnormal results are displayed) Labs Reviewed  CBC WITH DIFFERENTIAL/PLATELET - Abnormal; Notable for the following components:      Result Value   Neutro Abs 8.3 (*)    All other components within normal limits  COMPREHENSIVE METABOLIC PANEL - Abnormal; Notable for the following components:   Glucose, Bld 138 (*)    All other components within normal limits  URINALYSIS, ROUTINE W REFLEX MICROSCOPIC - Abnormal; Notable for the following components:   Specific Gravity, Urine >1.046 (*)    Hgb urine dipstick MODERATE (*)    Ketones, ur 20 (*)    Bacteria, UA RARE (*)    All other components within normal limits  LIPASE, BLOOD    EKG None  Radiology MR Abdomen W or Wo Contrast  Result Date: 02/11/2022 CLINICAL DATA:  Evaluate liver lesions seen on recent CT scan. EXAM: MRI ABDOMEN WITHOUT AND WITH CONTRAST TECHNIQUE: Multiplanar multisequence MR imaging of the abdomen was performed both before and after the administration of intravenous contrast. CONTRAST:  45mL GADAVIST GADOBUTROL 1 MMOL/ML IV SOLN COMPARISON:  CT scan 02/11/2022 FINDINGS: Lower chest: The lung bases are clear. No pleural or pericardial effusion. Hepatobiliary: Segment 6 hepatic lesion measures 2.2 cm and demonstrates high T2 signal intensity and lobulated borders. This demonstrates typical peripheral nodular enhancement with progressive filling and non delayed images and it maintains blood pool signal intensity on the delayed images. 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 No other hepatic lesions are identified. No intrahepatic biliary dilatation. The gallbladder is unremarkable. No common bile duct dilatation. Pancreas:  Normal Spleen:  Normal Adrenals/Urinary Tract: The adrenal glands are normal. There is left-sided hydronephrosis and perinephric interstitial changes and decreased perfusion compared to the right kidney all consistent with obstructing ureteral calculus seen on the CT scan. Stomach/Bowel:  The stomach, duodenum, visualized small bowel and visualized colon are unremarkable. Vascular/Lymphatic: The aorta and branch vessels are patent. The major venous structures are patent. No mesenteric or retroperitoneal mass or adenopathy. Other:  No ascites or abdominal wall hernia. Musculoskeletal: No significant bony findings. IMPRESSION: 1. 2.2 cm segment 6 hepatic lesion has MR imaging features of a benign hepatic hemangioma. 2. No significant abdominal findings. 3. Obstructive findings involving the left kidney as seen on the earlier CT scan. Electronically Signed   By: Marijo Sanes M.D.   On: 02/11/2022 20:47   CT ABDOMEN PELVIS W CONTRAST  Result Date: 02/11/2022 CLINICAL DATA:  Abdominal pain, acute, nonlocalized EXAM: CT ABDOMEN AND PELVIS WITH CONTRAST TECHNIQUE: Multidetector CT imaging of the abdomen and pelvis was performed using the standard protocol following bolus administration of intravenous contrast. RADIATION DOSE REDUCTION: This exam was performed according to the departmental dose-optimization program which includes automated exposure control, adjustment of the mA and/or kV according to  patient size and/or use of iterative reconstruction technique. CONTRAST:  75 mL Omnipaque 350 COMPARISON:  None Available. FINDINGS: Lower chest: Lung bases are clear. Hepatobiliary: Within the RIGHT hepatic lobe, round hypoenhancing lesion measures 31 mm x 25 mm (image 20/series 3). Gallbladder normal. Pancreas: Pancreas is normal. No ductal dilatation. No pancreatic inflammation. Spleen: Normal spleen Adrenals/urinary tract: Adrenal glands normal. Mild mucosal dilatation on the LEFT and mild hydro ureter on the LEFT. Hydroureter on the LEFT extends to the distal LEFT ureter where there is an obstructing calculus measuring 5 mm (7 1/3. This distal LEFT ureteral calculus is approximately 1 cm from the LEFT vesicoureteral junction. Additional small 2 mm calculus in the mid LEFT kidney no RIGHT  nephrolithiasis. No bladder calculi. Stomach/Bowel: The stomach, duodenum, and small bowel normal. Post appendectomy. The colon and rectosigmoid colon are normal. Vascular/Lymphatic: Abdominal aorta is normal caliber. No periportal or retroperitoneal adenopathy. No pelvic adenopathy. Reproductive: Prostate unremarkable Other: No free fluid. Musculoskeletal: No aggressive osseous lesion. IMPRESSION: 1. Partially obstructing calculus in the distal LEFT ureter. 2. Nonobstructing LEFT renal calculus. 3. Indeterminate hypoenhancing lesion in the RIGHT hepatic lobe. Recommend MRI with without contrast for further characterization. Electronically Signed   By: Genevive Bi M.D.   On: 02/11/2022 14:18    Procedures Procedures   Medications Ordered in ED Medications  ondansetron (ZOFRAN-ODT) disintegrating tablet 4 mg (4 mg Oral Given 02/11/22 1248)  oxyCODONE-acetaminophen (PERCOCET/ROXICET) 5-325 MG per tablet 1 tablet (1 tablet Oral Given 02/11/22 1248)  iohexol (OMNIPAQUE) 350 MG/ML injection 75 mL (75 mLs Intravenous Contrast Given 02/11/22 1403)  sodium chloride 0.9 % bolus 1,000 mL (0 mLs Intravenous Stopped 02/11/22 1953)  ketorolac (TORADOL) 15 MG/ML injection 15 mg (15 mg Intravenous Given 02/11/22 1650)  tamsulosin (FLOMAX) capsule 0.4 mg (0.4 mg Oral Given 02/11/22 1650)  gadobutrol (GADAVIST) 1 MMOL/ML injection 8 mL (8 mLs Intravenous Contrast Given 02/11/22 1856)    ED Course/ Medical Decision Making/ A&P                          Medical Decision Making Amount and/or Complexity of Data Reviewed Radiology: ordered.  Risk Prescription drug management.   62 year old male presents to the ED for evaluation.  Please see HPI for further details.  On examination the patient is afebrile and nontachycardic.  Patient lung sounds are clear bilaterally, he is not hypoxic anymore.  Patient abdomen soft and compressible, patient does have left-sided CVA tenderness.  Patient nontoxic in  appearance.  Patient workup initiated in triage includes CBC, CMP, lipase, urinalysis, CT abdomen pelvis with contrast.  Patient provided 1 L fluid, Zofran, Toradol, tamsulosin.  Patient CBC unremarkable, no leukocytosis or anemia.  Patient CMP unremarkable without electrolyte derangement.  Patient urinalysis shows hemoglobin.  Patient lipase unremarkable.  Patient CT abdomen pelvis with contrast shows a partially obstructing calculus in the distal left ureter as well as a nonobstructing left renal calculus.  There is also a noted hypoenhancing lesion in the patient liver.  The patient reports that he does have a history of this however based on chart review it appears that this hypoenhancing lesion has grown in size.  Recommendation is to conduct MRI with and without contrast of abdomen.  This imaging study has been ordered at this time.  Update: MRI of abdomen pelvis with contrast shows benign hepatic angioma.  Patient be discharged home advised to follow-up with his PCP for further management.  Patient advised to follow-up with  urology, he reports that he is still in contact with his urology office.  Patient will be discharged home with tamsulosin, and time nausea medication, pain medication.  Patient will be advised to follow back up with urology.  Patient provided return precautions and he voiced understanding.  Patient and all his questions answered to his satisfaction.  The patient is stable at this time for discharge home.  Final Clinical Impression(s) / ED Diagnoses Final diagnoses:  Kidney stone    Rx / DC Orders ED Discharge Orders          Ordered    ondansetron (ZOFRAN) 4 MG tablet  Every 6 hours PRN        02/11/22 2137    ketorolac (TORADOL) 10 MG tablet  Every 6 hours PRN        02/11/22 2137    HYDROcodone-acetaminophen (NORCO/VICODIN) 5-325 MG tablet  Every 4 hours PRN        02/11/22 2137    tamsulosin (FLOMAX) 0.4 MG CAPS capsule  Daily        02/11/22 2137               Al Decant, PA-C 02/11/22 2137    Tanda Rockers A, DO 02/12/22 1244

## 2022-02-18 DIAGNOSIS — Z125 Encounter for screening for malignant neoplasm of prostate: Secondary | ICD-10-CM | POA: Diagnosis not present

## 2022-02-18 DIAGNOSIS — N202 Calculus of kidney with calculus of ureter: Secondary | ICD-10-CM | POA: Diagnosis not present

## 2022-03-06 DIAGNOSIS — R350 Frequency of micturition: Secondary | ICD-10-CM | POA: Diagnosis not present

## 2022-03-06 DIAGNOSIS — R3912 Poor urinary stream: Secondary | ICD-10-CM | POA: Diagnosis not present

## 2022-03-06 DIAGNOSIS — K760 Fatty (change of) liver, not elsewhere classified: Secondary | ICD-10-CM | POA: Diagnosis not present

## 2022-03-06 DIAGNOSIS — N401 Enlarged prostate with lower urinary tract symptoms: Secondary | ICD-10-CM | POA: Diagnosis not present

## 2022-03-06 DIAGNOSIS — N200 Calculus of kidney: Secondary | ICD-10-CM | POA: Diagnosis not present

## 2022-08-03 DIAGNOSIS — Z0000 Encounter for general adult medical examination without abnormal findings: Secondary | ICD-10-CM | POA: Diagnosis not present

## 2022-08-03 DIAGNOSIS — H6122 Impacted cerumen, left ear: Secondary | ICD-10-CM | POA: Diagnosis not present

## 2022-08-03 DIAGNOSIS — H9209 Otalgia, unspecified ear: Secondary | ICD-10-CM | POA: Diagnosis not present

## 2022-08-03 DIAGNOSIS — Z125 Encounter for screening for malignant neoplasm of prostate: Secondary | ICD-10-CM | POA: Diagnosis not present

## 2022-08-24 DIAGNOSIS — M1712 Unilateral primary osteoarthritis, left knee: Secondary | ICD-10-CM | POA: Diagnosis not present

## 2022-08-24 DIAGNOSIS — M5010 Cervical disc disorder with radiculopathy, unspecified cervical region: Secondary | ICD-10-CM | POA: Diagnosis not present

## 2022-08-24 DIAGNOSIS — R202 Paresthesia of skin: Secondary | ICD-10-CM | POA: Diagnosis not present

## 2022-09-03 DIAGNOSIS — M47812 Spondylosis without myelopathy or radiculopathy, cervical region: Secondary | ICD-10-CM | POA: Diagnosis not present

## 2022-09-03 DIAGNOSIS — M4802 Spinal stenosis, cervical region: Secondary | ICD-10-CM | POA: Diagnosis not present

## 2022-09-10 ENCOUNTER — Telehealth: Payer: Self-pay | Admitting: Diagnostic Neuroimaging

## 2022-09-10 NOTE — Telephone Encounter (Signed)
Care everywhere 

## 2022-09-16 DIAGNOSIS — Z2821 Immunization not carried out because of patient refusal: Secondary | ICD-10-CM | POA: Diagnosis not present

## 2022-09-16 DIAGNOSIS — Z0000 Encounter for general adult medical examination without abnormal findings: Secondary | ICD-10-CM | POA: Diagnosis not present

## 2022-09-23 ENCOUNTER — Ambulatory Visit (INDEPENDENT_AMBULATORY_CARE_PROVIDER_SITE_OTHER): Payer: BC Managed Care – PPO | Admitting: Neurology

## 2022-09-23 ENCOUNTER — Encounter: Payer: Self-pay | Admitting: *Deleted

## 2022-09-23 ENCOUNTER — Encounter: Payer: Self-pay | Admitting: Neurology

## 2022-09-23 VITALS — BP 120/81 | HR 73 | Ht 68.0 in | Wt 189.0 lb

## 2022-09-23 DIAGNOSIS — R799 Abnormal finding of blood chemistry, unspecified: Secondary | ICD-10-CM | POA: Diagnosis not present

## 2022-09-23 DIAGNOSIS — E559 Vitamin D deficiency, unspecified: Secondary | ICD-10-CM | POA: Insufficient documentation

## 2022-09-23 DIAGNOSIS — R531 Weakness: Secondary | ICD-10-CM | POA: Diagnosis not present

## 2022-09-23 DIAGNOSIS — G379 Demyelinating disease of central nervous system, unspecified: Secondary | ICD-10-CM

## 2022-09-23 DIAGNOSIS — R269 Unspecified abnormalities of gait and mobility: Secondary | ICD-10-CM | POA: Diagnosis not present

## 2022-09-23 NOTE — Progress Notes (Signed)
[facility]  PATIENT: Duane Johnson DOB: September 21, 1960  REFERRING DOCTOR OR PCP: Alinda Deem, MD SOURCE: Patient, notes from primary care, imaging and lab reports, MRI images personally reviewed.  _________________________________   HISTORICAL  CHIEF COMPLAINT:  Chief Complaint  Patient presents with   new patinet     Pt in room 11. New patient here for weakness/numbness in upper and lower extremities, abnormal MRI concern for MS. Pt said burning in right hand, left food has burning, right leg is hard to move at times. Has headaches always on right side often. Pt said no falls, but has tripped a couple times. Constant pain in right shoulder blade feels like a poking sensation.    HISTORY OF PRESENT ILLNESS:  I had the pleasure of seeing a patient, Duane Johnson, at the MS center [facility] for a neurologic consultation regarding his abnormal cervical spine MRI, worrisome for multiple sclerosis.  He is a 62 year old man who began to note numbness in the right foot about 5 years ago.   Progressively, the whole right leg has had some numbness and he has numbness down the arm to the thumb and some pain in the shoulder and arm.  Starting in late 2023, he began to note some weakness in the right foot/ankle.  He noted reduced endurance on long walks.  There is milder weakness more proximally, only when he is more physically tired.  He can no longer run but can ride a bicycle.  He denies spasticity in the leg.  He notes urinary frequency.  However, there is no incontinence.    He used to walk 5 miles every morning but can now only due 1 to 2 miles    He notes mild reduced vision OD but colors are normal.     He has had headaches regularly the last few weeks.   These are right vertex mostly with a a constant ache.  Intensity is mild to moderate.     He rarely had HA before these  He denies much fatigue  He is physically active.  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     As part of his evaluation, he had an MRI of the cervical spine.  I personally reviewed the images.  There are T2 hyperintense foci within the spinal cord posterolaterally to the left adjacent to C3 and towards the right from C4 through C5-C6.  There are more subtle possible foci to the left at C2 and to the left at T1-T2.  There are some degenerative changes causing foraminal narrowing to the left at C3-C4, to the right at C4-C5, bilaterally at C5-C6, bilaterally at C6-C7.  No spinal stenosis.    REVIEW OF SYSTEMS: Constitutional: No fevers, chills, sweats, or change in appetite Eyes: No visual changes, double vision, eye pain Ear, nose and throat: No hearing loss, ear pain, nasal congestion, sore throat Cardiovascular: No chest pain, palpitations Respiratory:  No shortness of breath at rest or with exertion.   No wheezes GastrointestinaI: No nausea, vomiting, diarrhea, abdominal pain, fecal incontinence Genitourinary:  No dysuria, urinary retention or frequency.  No nocturia. Musculoskeletal:  No neck pain, back pain Integumentary: No rash, pruritus, skin lesions Neurological: as above Psychiatric: No depression at this time.  No anxiety Endocrine: No palpitations, diaphoresis, change in appetite, change in weigh or increased thirst Hematologic/Lymphatic:  No anemia, purpura, petechiae. Allergic/Immunologic: No  itchy/runny eyes, nasal congestion, recent allergic reactions, rashes  ALLERGIES: Allergies  Allergen Reactions   Penicillins Anaphylaxis and Rash    HOME MEDICATIONS:  Current Outpatient Medications:    cetirizine (ZYRTEC) 10 MG tablet, Take 10 mg by mouth daily. (Patient not taking: Reported on 09/23/2022), Disp: , Rfl:    HYDROcodone-acetaminophen (NORCO/VICODIN) 5-325 MG tablet, Take 2 tablets by mouth every 4 (four) hours as needed. (Patient not taking: Reported on 09/23/2022), Disp: 10  tablet, Rfl: 0   ketorolac (TORADOL) 10 MG tablet, Take 1 tablet (10 mg total) by mouth every 6 (six) hours as needed. (Patient not taking: Reported on 09/23/2022), Disp: 20 tablet, Rfl: 0   ondansetron (ZOFRAN) 4 MG tablet, Take 1 tablet (4 mg total) by mouth every 6 (six) hours as needed for nausea or vomiting. (Patient not taking: Reported on 09/23/2022), Disp: 15 tablet, Rfl: 0   oxyCODONE-acetaminophen (PERCOCET) 5-325 MG per tablet, Take 1-2 tablets by mouth every 6 (six) hours as needed for severe pain. (Patient not taking: Reported on 09/23/2022), Disp: 20 tablet, Rfl: 0   tamsulosin (FLOMAX) 0.4 MG CAPS capsule, Take 1 capsule (0.4 mg total) by mouth daily. (Patient not taking: Reported on 09/23/2022), Disp: 30 capsule, Rfl: 0  PAST MEDICAL HISTORY: Past Medical History:  Diagnosis Date   Renal stones     PAST SURGICAL HISTORY: Past Surgical History:  Procedure Laterality Date   APPENDECTOMY     meniscus repair Left     FAMILY HISTORY: Family History  Problem Relation Age of Onset   Alzheimer's disease Mother     SOCIAL HISTORY: Social History   Socioeconomic History   Marital status: Married    Spouse name: Not on file   Number of children: 1   Years of education: Not on file   Highest education level: Associate degree: academic program  Occupational History   Not on file  Tobacco Use   Smoking status: Never   Smokeless tobacco: Not on file  Vaping Use   Vaping status: Never Used  Substance and Sexual Activity   Alcohol use: Yes    Comment: occasional   Drug use: No   Sexual activity: Not on file  Other Topics Concern   Not on file  Social History Narrative   Occupation Production designer, theatre/television/film   Married   Caffiene 1 serving daily.   Social Determinants of Health   Financial Resource Strain: Not on file  Food Insecurity: Not on file  Transportation Needs: Not on file  Physical Activity: Not on file  Stress: Not on file  Social Connections: Not on file  Intimate Partner  Violence: Not on file       PHYSICAL EXAM  Vitals:   09/23/22 0837  BP: 120/81  Pulse: 73  Weight: 189 lb (85.7 kg)  Height: 5\' 8"  (1.727 m)    Body mass index is 28.74 kg/m.  Vision Screening   Right eye Left eye Both eyes  Without correction 20/50 20/30 20/30   With correction       General: The patient is well-developed and well-nourished and in no acute distress  HEENT:  Head is [place]/AT.  Sclera are anicteric.   Marland Kitchen  Neck: No carotid bruits are noted.  The neck is nontender.  Cardiovascular: The heart has a regular rate and rhythm with a normal S1 and S2. There were no murmurs, gallops or rubs.    Skin: Extremities are without rash or  edema.  Musculoskeletal:  Back is nontender  Neurologic Exam  Mental status: The patient is alert and oriented x 3 at the time of the examination. The patient has apparent normal recent and remote memory, with an apparently normal attention span and concentration ability.   Speech is normal.  Cranial nerves: Extraocular movements are full. Pupils are equal, round, and reactive to light and accomodation.  Color vision was normal.  Facial symmetry is present. There is good facial sensation to soft touch bilaterally.Facial strength is normal.  Trapezius and sternocleidomastoid strength is normal. No dysarthria is noted.  The tongue is midline, and the patient has symmetric elevation of the soft palate. No obvious hearing deficits are noted.  Motor:  Muscle bulk is normal.   Tone is mildly increased in the right leg Strength is  4+/5 in intrinsic hand muscles and intrinsic toe muscles on the right.   5 / 5 elsewhere  Sensory: Sensory testing is intact to pinprick, soft touch and vibration sensation in all 4 extremities.  Coordination: Cerebellar testing reveals good finger-nose-finger and heel-to-shin bilaterally.  Gait and station: Station is normal.   Gait is near normal. Tandem gait is mildly wide. Able to toe and heel step.  Romberg is  negative.   Reflexes: Deep tendon reflexes are symmetric and mildly increased in arms, increased in legs with crossed adductors at knees, R>L and sustained clonus at right ankle and increased on left with one beat clonus     Plantar responses are extensor right.    DIAGNOSTIC DATA (LABS, IMAGING, TESTING) - I reviewed patient records, labs, notes, testing and imaging myself where available.  Lab Results  Component Value Date   WBC 10.1 02/11/2022   HGB 14.7 02/11/2022   HCT 42.8 02/11/2022   MCV 89.2 02/11/2022   PLT 237 02/11/2022      Component Value Date/Time   NA 139 02/11/2022 1122   K 4.1 02/11/2022 1122   CL 107 02/11/2022 1122   CO2 24 02/11/2022 1122   GLUCOSE 138 (H) 02/11/2022 1122   BUN 15 02/11/2022 1122   CREATININE 1.04 02/11/2022 1122   CALCIUM 9.0 02/11/2022 1122   PROT 6.8 02/11/2022 1122   ALBUMIN 4.1 02/11/2022 1122   AST 20 02/11/2022 1122   ALT 28 02/11/2022 1122   ALKPHOS 61 02/11/2022 1122   BILITOT 0.8 02/11/2022 1122   GFRNONAA >60 02/11/2022 1122       ASSESSMENT AND PLAN  Demyelinating disease (HCC) - Plan: Anti-MOG, Serum, Neuromyelitis optica autoab, IgG, ANCA Profile, VITAMIN D 25 Hydroxy (Vit-D Deficiency, Fractures), MR BRAIN W WO CONTRAST, CANCELED: MR BRAIN W WO CONTRAST  Right sided weakness  Gait disturbance  Vitamin D deficiency   In summary, Mr. Johnsey is a 62 year old man with abnormal foci in the cervical spine consistent with demyelinating plaque.  Multiple sclerosis is most likely diagnosis and the appearance of the foci are consistent with that diagnosis.  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  We need to check an MRI of the brain.  If there are additional foci consistent with MS, we can be more certain of the diagnosis.  If the brain is fairly normal for age we would need to consider a lumbar puncture to obtain CSF to determine if there are oligoclonal bands.  I will also check blood work to  rule out MS mimics (anti-MOG, NMO IgG, ANCA) we briefly discussed that there is only 1 FDA approved medication for primary progressive MS and I gave him some additional information on Ocrevus.  Based  on the additional studies, we will get him enrolled to begin the therapy if appropriate, after checking for chronic infections.  The dysesthesias are uncomfortable but for the time being he would prefer not to be treated with a medication for central neuropathic pain (i.e. gabapentin or lamotrigine or a tricyclic)  He will return to see me in 3 months but we will let him know the results of the studies and proceed with the next step as indicated.  He should call for new or worsening neurologic symptoms.  Thank you for asking me to see Mr. Ma.  Please let me know if I can be of further assistance with him or other patients in the future.   Yekaterina Escutia A. Epimenio Foot, MD, [facility] 09/23/2022, 9:04 AM Certified in Neurology, Clinical Neurophysiology, Sleep Medicine and Neuroimaging  [facility] [address], [address] [place], [place] [address] [phone]

## 2022-09-25 LAB — ANCA PROFILE
Anti-MPO Antibodies: <0.2 U (ref 0.0–0.9)
Anti-PR3 Antibodies: <0.2 U (ref 0.0–0.9)
Atypical pANCA: <1:20 {titer}
C-ANCA: <1:20 {titer}
P-ANCA: <1:20 {titer}

## 2022-09-25 LAB — NEUROMYELITIS OPTICA AUTOAB, IGG: NMO IgG Autoantibodies: <1.5 U/mL (ref 0.0–3.0)

## 2022-09-25 LAB — ANTI-MOG, SERUM: MOG Antibody, Cell-based IFA: NEGATIVE

## 2022-09-25 LAB — VITAMIN D 25 HYDROXY (VIT D DEFICIENCY, FRACTURES): Vit D, 25-Hydroxy: 30.8 ng/mL (ref 30.0–100.0)

## 2022-09-29 ENCOUNTER — Ambulatory Visit (INDEPENDENT_AMBULATORY_CARE_PROVIDER_SITE_OTHER): Payer: BC Managed Care – PPO

## 2022-09-29 ENCOUNTER — Telehealth: Payer: Self-pay | Admitting: Neurology

## 2022-09-29 DIAGNOSIS — G379 Demyelinating disease of central nervous system, unspecified: Secondary | ICD-10-CM | POA: Diagnosis not present

## 2022-09-29 MED ORDER — GADOBENATE DIMEGLUMINE 529 MG/ML IV SOLN
15.0000 mL | Freq: Once | INTRAVENOUS | Status: AC | PRN
  Administered 2022-09-29: 15 mL via INTRAVENOUS

## 2022-09-29 NOTE — Telephone Encounter (Signed)
The MRI of the brain was normal for age with just 3 or 4 punctate T2/FLAIR hyperintense foci in the subcortical or deep white matter.  This is not typical for demyelination and more typical for very minimal age-appropriate chronic microvascular ischemic change.  The lab work for anti-MOG, NMO IgG and ANCA was negative.  Vitamin D was low normal.  I discussed options.  I recommend that we check a lumbar puncture to determine if there is oligoclonal bands in the CSF.  If present, this would support the diagnosis of MS and I would recommend that we begin Ocrevus.  If not present, then MS is less likely and I would consider reimaging in a year or so.

## 2022-10-08 DIAGNOSIS — G35 Multiple sclerosis: Secondary | ICD-10-CM | POA: Diagnosis not present

## 2022-10-08 DIAGNOSIS — R202 Paresthesia of skin: Secondary | ICD-10-CM | POA: Diagnosis not present

## 2022-10-13 DIAGNOSIS — R202 Paresthesia of skin: Secondary | ICD-10-CM | POA: Diagnosis not present

## 2022-10-13 DIAGNOSIS — M21372 Foot drop, left foot: Secondary | ICD-10-CM | POA: Diagnosis not present

## 2022-10-13 DIAGNOSIS — G959 Disease of spinal cord, unspecified: Secondary | ICD-10-CM | POA: Diagnosis not present

## 2022-10-13 DIAGNOSIS — M21379 Foot drop, unspecified foot: Secondary | ICD-10-CM | POA: Diagnosis not present

## 2022-10-13 DIAGNOSIS — G373 Acute transverse myelitis in demyelinating disease of central nervous system: Secondary | ICD-10-CM | POA: Diagnosis not present

## 2022-10-13 DIAGNOSIS — Z880 Allergy status to penicillin: Secondary | ICD-10-CM | POA: Diagnosis not present

## 2022-10-13 DIAGNOSIS — R531 Weakness: Secondary | ICD-10-CM | POA: Diagnosis not present

## 2022-10-13 DIAGNOSIS — R29898 Other symptoms and signs involving the musculoskeletal system: Secondary | ICD-10-CM | POA: Diagnosis not present

## 2022-10-13 DIAGNOSIS — R200 Anesthesia of skin: Secondary | ICD-10-CM | POA: Diagnosis not present

## 2022-10-20 DIAGNOSIS — R531 Weakness: Secondary | ICD-10-CM | POA: Diagnosis not present

## 2022-10-20 DIAGNOSIS — M21379 Foot drop, unspecified foot: Secondary | ICD-10-CM | POA: Diagnosis not present

## 2022-10-20 DIAGNOSIS — R29898 Other symptoms and signs involving the musculoskeletal system: Secondary | ICD-10-CM | POA: Diagnosis not present

## 2022-11-02 DIAGNOSIS — R531 Weakness: Secondary | ICD-10-CM | POA: Diagnosis not present

## 2022-11-02 DIAGNOSIS — M21379 Foot drop, unspecified foot: Secondary | ICD-10-CM | POA: Diagnosis not present

## 2022-11-11 DIAGNOSIS — E559 Vitamin D deficiency, unspecified: Secondary | ICD-10-CM | POA: Diagnosis not present

## 2022-11-11 DIAGNOSIS — Z8572 Personal history of non-Hodgkin lymphomas: Secondary | ICD-10-CM | POA: Diagnosis not present

## 2022-11-11 DIAGNOSIS — G35 Multiple sclerosis: Secondary | ICD-10-CM | POA: Diagnosis not present

## 2022-12-16 DIAGNOSIS — Z5181 Encounter for therapeutic drug level monitoring: Secondary | ICD-10-CM | POA: Diagnosis not present

## 2022-12-16 DIAGNOSIS — E559 Vitamin D deficiency, unspecified: Secondary | ICD-10-CM | POA: Diagnosis not present

## 2022-12-23 ENCOUNTER — Ambulatory Visit: Payer: BC Managed Care – PPO | Admitting: Neurology

## 2023-01-01 DIAGNOSIS — G35 Multiple sclerosis: Secondary | ICD-10-CM | POA: Diagnosis not present

## 2023-01-01 DIAGNOSIS — Z8572 Personal history of non-Hodgkin lymphomas: Secondary | ICD-10-CM | POA: Diagnosis not present

## 2023-06-11 DIAGNOSIS — G35 Multiple sclerosis: Secondary | ICD-10-CM | POA: Diagnosis not present

## 2023-06-11 DIAGNOSIS — M4802 Spinal stenosis, cervical region: Secondary | ICD-10-CM | POA: Diagnosis not present

## 2023-06-11 DIAGNOSIS — Z8572 Personal history of non-Hodgkin lymphomas: Secondary | ICD-10-CM | POA: Diagnosis not present

## 2023-06-11 DIAGNOSIS — M47812 Spondylosis without myelopathy or radiculopathy, cervical region: Secondary | ICD-10-CM | POA: Diagnosis not present

## 2023-09-02 DIAGNOSIS — G35 Multiple sclerosis: Secondary | ICD-10-CM | POA: Diagnosis not present
